# Patient Record
Sex: Male | Born: 1945 | Race: White | Hispanic: No | Marital: Married | Smoking: Never smoker
Health system: Southern US, Community
[De-identification: ages and names within clinical notes are randomized; demographics above are authoritative.]

## PROBLEM LIST (undated history)

## (undated) DIAGNOSIS — I10 Essential (primary) hypertension: Secondary | ICD-10-CM

---

## 2005-04-08 ENCOUNTER — Ambulatory Visit: Payer: Self-pay | Admitting: Gastroenterology

## 2013-03-30 DIAGNOSIS — Z85828 Personal history of other malignant neoplasm of skin: Secondary | ICD-10-CM | POA: Insufficient documentation

## 2016-08-27 DIAGNOSIS — Z96651 Presence of right artificial knee joint: Secondary | ICD-10-CM | POA: Diagnosis not present

## 2016-08-27 DIAGNOSIS — I10 Essential (primary) hypertension: Secondary | ICD-10-CM | POA: Diagnosis not present

## 2016-08-27 DIAGNOSIS — Z471 Aftercare following joint replacement surgery: Secondary | ICD-10-CM | POA: Diagnosis not present

## 2016-08-30 DIAGNOSIS — I10 Essential (primary) hypertension: Secondary | ICD-10-CM | POA: Diagnosis not present

## 2016-08-30 DIAGNOSIS — Z96651 Presence of right artificial knee joint: Secondary | ICD-10-CM | POA: Diagnosis not present

## 2016-08-30 DIAGNOSIS — Z471 Aftercare following joint replacement surgery: Secondary | ICD-10-CM | POA: Diagnosis not present

## 2016-09-01 DIAGNOSIS — Z471 Aftercare following joint replacement surgery: Secondary | ICD-10-CM | POA: Diagnosis not present

## 2016-09-01 DIAGNOSIS — Z96651 Presence of right artificial knee joint: Secondary | ICD-10-CM | POA: Diagnosis not present

## 2016-09-01 DIAGNOSIS — I10 Essential (primary) hypertension: Secondary | ICD-10-CM | POA: Diagnosis not present

## 2016-09-03 DIAGNOSIS — Z471 Aftercare following joint replacement surgery: Secondary | ICD-10-CM | POA: Diagnosis not present

## 2016-09-03 DIAGNOSIS — I10 Essential (primary) hypertension: Secondary | ICD-10-CM | POA: Diagnosis not present

## 2016-09-03 DIAGNOSIS — Z96651 Presence of right artificial knee joint: Secondary | ICD-10-CM | POA: Diagnosis not present

## 2016-09-06 DIAGNOSIS — I10 Essential (primary) hypertension: Secondary | ICD-10-CM | POA: Diagnosis not present

## 2016-09-06 DIAGNOSIS — Z471 Aftercare following joint replacement surgery: Secondary | ICD-10-CM | POA: Diagnosis not present

## 2016-09-06 DIAGNOSIS — Z96651 Presence of right artificial knee joint: Secondary | ICD-10-CM | POA: Diagnosis not present

## 2016-09-08 DIAGNOSIS — Z96651 Presence of right artificial knee joint: Secondary | ICD-10-CM | POA: Diagnosis not present

## 2016-09-08 DIAGNOSIS — I10 Essential (primary) hypertension: Secondary | ICD-10-CM | POA: Diagnosis not present

## 2016-09-08 DIAGNOSIS — Z471 Aftercare following joint replacement surgery: Secondary | ICD-10-CM | POA: Diagnosis not present

## 2016-12-21 DIAGNOSIS — K529 Noninfective gastroenteritis and colitis, unspecified: Secondary | ICD-10-CM | POA: Diagnosis not present

## 2017-03-14 DIAGNOSIS — Z0000 Encounter for general adult medical examination without abnormal findings: Secondary | ICD-10-CM | POA: Diagnosis not present

## 2017-03-14 DIAGNOSIS — Z6833 Body mass index (BMI) 33.0-33.9, adult: Secondary | ICD-10-CM | POA: Diagnosis not present

## 2017-03-14 DIAGNOSIS — Z96651 Presence of right artificial knee joint: Secondary | ICD-10-CM | POA: Diagnosis not present

## 2017-03-14 DIAGNOSIS — E7800 Pure hypercholesterolemia, unspecified: Secondary | ICD-10-CM | POA: Diagnosis not present

## 2017-03-14 DIAGNOSIS — Z7982 Long term (current) use of aspirin: Secondary | ICD-10-CM | POA: Diagnosis not present

## 2017-03-14 DIAGNOSIS — E669 Obesity, unspecified: Secondary | ICD-10-CM | POA: Diagnosis not present

## 2017-03-14 DIAGNOSIS — K219 Gastro-esophageal reflux disease without esophagitis: Secondary | ICD-10-CM | POA: Diagnosis not present

## 2017-03-14 DIAGNOSIS — I10 Essential (primary) hypertension: Secondary | ICD-10-CM | POA: Diagnosis not present

## 2017-03-14 DIAGNOSIS — Z791 Long term (current) use of non-steroidal anti-inflammatories (NSAID): Secondary | ICD-10-CM | POA: Diagnosis not present

## 2017-03-14 DIAGNOSIS — M1A9XX0 Chronic gout, unspecified, without tophus (tophi): Secondary | ICD-10-CM | POA: Diagnosis not present

## 2017-04-13 DIAGNOSIS — M79672 Pain in left foot: Secondary | ICD-10-CM | POA: Diagnosis not present

## 2017-04-19 ENCOUNTER — Other Ambulatory Visit: Payer: Self-pay | Admitting: Podiatry

## 2017-04-19 ENCOUNTER — Ambulatory Visit (INDEPENDENT_AMBULATORY_CARE_PROVIDER_SITE_OTHER): Payer: Medicare HMO | Admitting: Podiatry

## 2017-04-19 ENCOUNTER — Encounter: Payer: Self-pay | Admitting: Podiatry

## 2017-04-19 ENCOUNTER — Ambulatory Visit (INDEPENDENT_AMBULATORY_CARE_PROVIDER_SITE_OTHER): Payer: Medicare HMO

## 2017-04-19 VITALS — BP 153/85 | HR 72 | Temp 98.8°F | Resp 16

## 2017-04-19 DIAGNOSIS — M722 Plantar fascial fibromatosis: Secondary | ICD-10-CM

## 2017-04-19 DIAGNOSIS — M779 Enthesopathy, unspecified: Secondary | ICD-10-CM

## 2017-04-19 MED ORDER — BETAMETHASONE SOD PHOS & ACET 6 (3-3) MG/ML IJ SUSP: 3.0000 mg | Freq: Once | INTRAMUSCULAR | Status: DC

## 2017-04-19 NOTE — Progress Notes (Signed)
   Subjective:    Patient ID: Gary Owens, male    DOB: 02/29/1946, 71 y.o.   MRN: [id number]  HPI    Review of Systems  Musculoskeletal: Positive for gait problem.  All other systems reviewed and are negative.      Objective:   Physical Exam        Assessment & Plan:

## 2017-04-21 NOTE — Progress Notes (Signed)
   Subjective: Patient presents today for intermittent sharp pain and tenderness in the left heel that began three weeks ago. He reports a dull aching pain when walking. He reports mild associated swelling of the left ankle. Patient states that it hurts in the mornings with the first steps out of bed. Walking and standing for long periods of time also exacerbates the pain. Icing and resting the foot along with taking Aleve help alleviate the pain. Patient presents today for further treatment and evaluation.   No past medical history on file.   Objective: Physical Exam General: The patient is alert and oriented x3 in no acute distress.  Dermatology: Skin is warm, dry and supple bilateral lower extremities. Negative for open lesions or macerations bilateral.   Vascular: Dorsalis Pedis and Posterior Tibial pulses palpable bilateral.  Capillary fill time is immediate to all digits.  Neurological: Epicritic and protective threshold intact bilateral.   Musculoskeletal: Tenderness to palpation at the medial calcaneal tubercale and through the insertion of the plantar fascia of the left foot. All other joints range of motion within normal limits bilateral. Strength 5/5 in all groups bilateral.   Radiographic exam:   Normal osseous mineralization. Joint spaces preserved. No fracture/dislocation/boney destruction. Calcaneal spur present with mild thickening of plantar fascia left. No other soft tissue abnormalities or radiopaque foreign bodies.   Assessment: 1. Plantar fasciitis left foot  Plan of Care:  1. Patient evaluated. Xrays reviewed.   2. Injection of 0.5cc Celestone soluspan injected into the left plantar fascia.  3. Continue taking OTC Aleve as directed.  4. Plantar fascial band(s) dispensed  5. Continue wearing orthotics with New Balance shoes.  6. Return to clinic in 4 weeks.     Felecia ShellingBrent M. Evans, DPM [facility]  Dr. Felecia ShellingBrent M. Evans, DPM    [address]                                      [place], [place] [address]                Office [phone]  Fax [phone]) [phone]

## 2017-05-17 ENCOUNTER — Encounter: Payer: Self-pay | Admitting: Podiatry

## 2017-05-17 ENCOUNTER — Ambulatory Visit (INDEPENDENT_AMBULATORY_CARE_PROVIDER_SITE_OTHER): Payer: Medicare HMO | Admitting: Podiatry

## 2017-05-17 DIAGNOSIS — M722 Plantar fascial fibromatosis: Secondary | ICD-10-CM

## 2017-05-19 NOTE — Progress Notes (Signed)
   Subjective: Patient presents today for follow-up evaluation of left plantar fasciitis.  The patient reports significant relief.  He has been wearing the fascial brace which has helped alleviate the pain.  He denies any pain at this time.  There are no new complaints.  Patient presents today for further treatment and evaluation.   History reviewed. No pertinent past medical history.   Objective: Physical Exam General: The patient is alert and oriented x3 in no acute distress.  Dermatology: Skin is warm, dry and supple bilateral lower extremities. Negative for open lesions or macerations bilateral.   Vascular: Dorsalis Pedis and Posterior Tibial pulses palpable bilateral.  Capillary fill time is immediate to all digits.  Neurological: Epicritic and protective threshold intact bilateral.   Musculoskeletal: Range of motion within normal limits to all pedal and ankle joints bilateral. Muscle strength 5/5 in all groups bilateral.    Assessment: 1. Plantar fasciitis left foot - resolved  Plan of Care:  1. Patient evaluated. 2.  Continue wearing plantar fascia brace. 3.  Continue taking OTC Aleve. 4.  Continue wearing new balance shoes and insoles. 5.  Return to clinic as needed.  Felecia ShellingBrent M. Conlin Brahm, DPM [facility]  Dr. Felecia ShellingBrent M. Abreanna Drawdy, DPM    [address]                                     [place], [place] [address]                Office [phone]  Fax [phone]) [phone]

## 2017-05-30 DIAGNOSIS — H6501 Acute serous otitis media, right ear: Secondary | ICD-10-CM | POA: Diagnosis not present

## 2017-05-30 DIAGNOSIS — H6123 Impacted cerumen, bilateral: Secondary | ICD-10-CM | POA: Diagnosis not present

## 2017-11-28 DIAGNOSIS — Z6834 Body mass index (BMI) 34.0-34.9, adult: Secondary | ICD-10-CM | POA: Diagnosis not present

## 2017-11-28 DIAGNOSIS — Z809 Family history of malignant neoplasm, unspecified: Secondary | ICD-10-CM | POA: Diagnosis not present

## 2017-11-28 DIAGNOSIS — M109 Gout, unspecified: Secondary | ICD-10-CM | POA: Diagnosis not present

## 2017-11-28 DIAGNOSIS — Z791 Long term (current) use of non-steroidal anti-inflammatories (NSAID): Secondary | ICD-10-CM | POA: Diagnosis not present

## 2017-11-28 DIAGNOSIS — E669 Obesity, unspecified: Secondary | ICD-10-CM | POA: Diagnosis not present

## 2017-11-28 DIAGNOSIS — I10 Essential (primary) hypertension: Secondary | ICD-10-CM | POA: Diagnosis not present

## 2017-11-28 DIAGNOSIS — E785 Hyperlipidemia, unspecified: Secondary | ICD-10-CM | POA: Diagnosis not present

## 2017-11-28 DIAGNOSIS — L299 Pruritus, unspecified: Secondary | ICD-10-CM | POA: Diagnosis not present

## 2017-11-28 DIAGNOSIS — Z8249 Family history of ischemic heart disease and other diseases of the circulatory system: Secondary | ICD-10-CM | POA: Diagnosis not present

## 2017-11-28 DIAGNOSIS — K219 Gastro-esophageal reflux disease without esophagitis: Secondary | ICD-10-CM | POA: Diagnosis not present

## 2018-01-05 DIAGNOSIS — R69 Illness, unspecified: Secondary | ICD-10-CM | POA: Diagnosis not present

## 2018-02-27 ENCOUNTER — Emergency Department
Admission: EM | Admit: 2018-02-27 | Discharge: 2018-02-27 | Disposition: A | Discharge status: Home / Self Care | Payer: Medicare HMO | Attending: Emergency Medicine | Admitting: Emergency Medicine

## 2018-02-27 ENCOUNTER — Emergency Department: Payer: Medicare HMO

## 2018-02-27 ENCOUNTER — Other Ambulatory Visit: Payer: Self-pay

## 2018-02-27 ENCOUNTER — Encounter: Payer: Self-pay | Admitting: Emergency Medicine

## 2018-02-27 DIAGNOSIS — I10 Essential (primary) hypertension: Secondary | ICD-10-CM | POA: Insufficient documentation

## 2018-02-27 DIAGNOSIS — E860 Dehydration: Secondary | ICD-10-CM | POA: Diagnosis not present

## 2018-02-27 DIAGNOSIS — R531 Weakness: Secondary | ICD-10-CM | POA: Diagnosis not present

## 2018-02-27 DIAGNOSIS — A09 Infectious gastroenteritis and colitis, unspecified: Secondary | ICD-10-CM | POA: Diagnosis not present

## 2018-02-27 DIAGNOSIS — R509 Fever, unspecified: Secondary | ICD-10-CM | POA: Diagnosis not present

## 2018-02-27 DIAGNOSIS — R197 Diarrhea, unspecified: Secondary | ICD-10-CM | POA: Diagnosis not present

## 2018-02-27 DIAGNOSIS — R000 Tachycardia, unspecified: Secondary | ICD-10-CM | POA: Diagnosis not present

## 2018-02-27 DIAGNOSIS — Z79899 Other long term (current) drug therapy: Secondary | ICD-10-CM | POA: Insufficient documentation

## 2018-02-27 HISTORY — DX: Essential (primary) hypertension: I10

## 2018-02-27 LAB — GASTROINTESTINAL PANEL BY PCR, STOOL (REPLACES STOOL CULTURE)
Adenovirus F40/41: NOT DETECTED
Astrovirus: NOT DETECTED
CYCLOSPORA CAYETANENSIS: NOT DETECTED
Campylobacter species: DETECTED — AB
Cryptosporidium: NOT DETECTED
ENTAMOEBA HISTOLYTICA: NOT DETECTED
ENTEROAGGREGATIVE E COLI (EAEC): NOT DETECTED
Enteropathogenic E coli (EPEC): DETECTED — AB
Enterotoxigenic E coli (ETEC): NOT DETECTED
GIARDIA LAMBLIA: NOT DETECTED
NOROVIRUS GI/GII: NOT DETECTED
Plesimonas shigelloides: NOT DETECTED
Rotavirus A: NOT DETECTED
SALMONELLA SPECIES: NOT DETECTED
SHIGELLA/ENTEROINVASIVE E COLI (EIEC): NOT DETECTED
Sapovirus (I, II, IV, and V): NOT DETECTED
Shiga like toxin producing E coli (STEC): NOT DETECTED
VIBRIO CHOLERAE: NOT DETECTED
VIBRIO SPECIES: NOT DETECTED
Yersinia enterocolitica: NOT DETECTED

## 2018-02-27 LAB — C DIFFICILE QUICK SCREEN W PCR REFLEX
C DIFFICLE (CDIFF) ANTIGEN: NEGATIVE
C Diff interpretation: NOT DETECTED
C Diff toxin: NEGATIVE

## 2018-02-27 LAB — COMPREHENSIVE METABOLIC PANEL
ALT: 25 U/L (ref 0–44)
AST: 24 U/L (ref 15–41)
Albumin: 4.5 g/dL (ref 3.5–5.0)
Alkaline Phosphatase: 64 U/L (ref 38–126)
Anion gap: 11 (ref 5–15)
BILIRUBIN TOTAL: 1 mg/dL (ref 0.3–1.2)
BUN: 20 mg/dL (ref 8–23)
CHLORIDE: 99 mmol/L (ref 98–111)
CO2: 24 mmol/L (ref 22–32)
CREATININE: 1.24 mg/dL (ref 0.61–1.24)
Calcium: 8.9 mg/dL (ref 8.9–10.3)
GFR, EST NON AFRICAN AMERICAN: 56 mL/min — AB (ref >60)
Glucose, Bld: 158 mg/dL — ABNORMAL HIGH (ref 70–99)
Potassium: 3.6 mmol/L (ref 3.5–5.1)
Sodium: 134 mmol/L — ABNORMAL LOW (ref 135–145)
TOTAL PROTEIN: 7.5 g/dL (ref 6.5–8.1)

## 2018-02-27 LAB — CBC WITH DIFFERENTIAL/PLATELET
BASOS ABS: 0 10*3/uL (ref 0–0.1)
Basophils Relative: 0 %
EOS PCT: 0 %
Eosinophils Absolute: 0 10*3/uL (ref 0–0.7)
HCT: 41.8 % (ref 40.0–52.0)
HEMOGLOBIN: 14.6 g/dL (ref 13.0–18.0)
LYMPHS ABS: 0.9 10*3/uL — AB (ref 1.0–3.6)
Lymphocytes Relative: 7 %
MCH: 29.8 pg (ref 26.0–34.0)
MCHC: 35 g/dL (ref 32.0–36.0)
MCV: 85.1 fL (ref 80.0–100.0)
Monocytes Absolute: 0.7 10*3/uL (ref 0.2–1.0)
Monocytes Relative: 5 %
NEUTROS PCT: 88 %
Neutro Abs: 11.8 10*3/uL — ABNORMAL HIGH (ref 1.4–6.5)
PLATELETS: 164 10*3/uL (ref 150–440)
RBC: 4.92 MIL/uL (ref 4.40–5.90)
RDW: 14.1 % (ref 11.5–14.5)
WBC: 13.5 10*3/uL — AB (ref 3.8–10.6)

## 2018-02-27 LAB — URINALYSIS, COMPLETE (UACMP) WITH MICROSCOPIC
BILIRUBIN URINE: NEGATIVE
Bacteria, UA: NONE SEEN
GLUCOSE, UA: NEGATIVE mg/dL
Ketones, ur: NEGATIVE mg/dL
LEUKOCYTES UA: NEGATIVE
NITRITE: NEGATIVE
PROTEIN: 100 mg/dL — AB
SPECIFIC GRAVITY, URINE: 1.019 (ref 1.005–1.030)
Squamous Epithelial / LPF: NONE SEEN (ref 0–5)
pH: 5 (ref 5.0–8.0)

## 2018-02-27 LAB — TROPONIN I: Troponin I: <0.03 ng/mL (ref <0.03)

## 2018-02-27 LAB — INFLUENZA PANEL BY PCR (TYPE A & B)
INFLAPCR: NEGATIVE
Influenza B By PCR: NEGATIVE

## 2018-02-27 LAB — LACTIC ACID, PLASMA: LACTIC ACID, VENOUS: 1.8 mmol/L (ref 0.5–1.9)

## 2018-02-27 MED ORDER — SODIUM CHLORIDE 0.9 % IV BOLUS
1000.0000 mL | Freq: Once | INTRAVENOUS | Status: AC
  Administered 2018-02-27: 1000 mL via INTRAVENOUS

## 2018-02-27 MED ORDER — ACETAMINOPHEN 500 MG PO TABS
1000.0000 mg | ORAL_TABLET | Freq: Once | ORAL | Status: AC
  Administered 2018-02-27: 1000 mg via ORAL

## 2018-02-27 MED ORDER — AZITHROMYCIN 500 MG PO TABS: 500.0000 mg | ORAL_TABLET | Freq: Every day | ORAL | 0 refills | Status: AC

## 2018-02-27 MED ORDER — AZITHROMYCIN 500 MG PO TABS
500.0000 mg | ORAL_TABLET | Freq: Once | ORAL | Status: AC
  Administered 2018-02-27: 500 mg via ORAL
  Filled 2018-02-27: qty 1

## 2018-02-27 NOTE — ED Triage Notes (Signed)
Pt in via EMS from [facility] with c/o weakness and fever for the last couple of days. EMS reports per facility pt went to the BR, felt weak and slid down to the floor.

## 2018-02-27 NOTE — ED Provider Notes (Addendum)
[facility] Emergency Department Provider Note  ____________________________________________   I have reviewed the triage vital signs and the nursing notes. Where available I have reviewed prior notes and, if possible and indicated, outside hospital notes.    HISTORY  Chief Complaint Weakness    HPI Gary Owens is a 72 y.o. male who presents today complaining getting weak.  Is not been nauseated or vomited but he has had decreased appetite since last night.  And then today he had one very watery stool.  As he was getting off the toilet he felt lightheaded and gradually sank down to the floor.  He felt very weak and was unable to get up immediately.  He did not hit his head did not pass out no chest pain or shortness of breath.  Check his temperature at home but he did feel that he was getting a fever today.  No recent travel no recent antibiotics no abdominal tenderness.  No focal numbness or weakness no chest pain or shortness of breath.    Past Medical History:  Diagnosis Date  . Hypertension     There are no active problems to display for this patient.   History reviewed. No pertinent surgical history.  Prior to Admission medications   Medication Sig Start Date End Date Taking? Authorizing Provider  allopurinol (ZYLOPRIM) 100 MG tablet Take 100 mg daily by mouth. 03/19/17  Yes [provider]  amLODipine (NORVASC) 5 MG tablet Take 5 mg daily by mouth. 03/19/17  Yes [provider]  atorvastatin (LIPITOR) 40 MG tablet Take 40 mg daily by mouth. 03/19/17  Yes [provider]  carvedilol (COREG) 12.5 MG tablet Take 12.5 mg by mouth 2 (two) times daily.    Yes [provider]  lisinopril (PRINIVIL,ZESTRIL) 40 MG tablet Take 40 mg by mouth daily.    Yes [provider]  omeprazole (PRILOSEC) 40 MG capsule Take 40 mg daily by mouth. 03/19/17  Yes [provider]  amoxicillin (AMOXIL) 500 MG capsule TAKE  4 CAPSULES BY MOUTH 1 HOUR PRIOR TO DENTAL TREATMENT 04/11/17   [provider]    Allergies Codeine and Hydrochlorothiazide  No family history on file.  Social History Social History   Tobacco Use  . Smoking status: Never Smoker  . Smokeless tobacco: Never Used  Substance Use Topics  . Alcohol use: Yes    Comment: 1 drink/month  . Drug use: No    Review of Systems Constitutional: No fever/chills Eyes: No visual changes. ENT: No sore throat. No stiff neck no neck pain Cardiovascular: Denies chest pain. Respiratory: Denies shortness of breath. Gastrointestinal:   no vomiting.  + diarrhea.  No constipation. Genitourinary: Negative for dysuria. Musculoskeletal: Negative lower extremity swelling Skin: Negative for rash. Neurological: Negative for severe headaches, focal weakness or numbness.   ____________________________________________   PHYSICAL EXAM:  VITAL SIGNS: ED Triage Vitals  Enc Vitals Group     BP 02/27/18 1348 (!) 155/76     Pulse Rate 02/27/18 1348 (!) 103     Resp 02/27/18 1348 20     Temp 02/27/18 1348 (!) 102.7 F (39.3 C)     Temp Source 02/27/18 1348 Oral     SpO2 02/27/18 1348 96 %     Weight 02/27/18 1349 170 lb (77.1 kg)     Height 02/27/18 1349 5\' 11"  (1.803 m)     Head Circumference --      Peak Flow --  Pain Score 02/27/18 1349 0     Pain Loc --      Pain Edu? --      Excl. in GC? --     Constitutional: Alert and oriented. Well appearing and in no acute distress. Eyes: Conjunctivae are normal Head: Atraumatic HEENT: No congestion/rhinnorhea. Mucous membranes are moist.  Oropharynx non-erythematous Neck:   Nontender with no meningismus, no masses, no stridor Cardiovascular: Normal rate, regular rhythm. Grossly normal heart sounds.  Good peripheral circulation. Respiratory: Normal respiratory effort.  No retractions. Lungs CTAB. Abdominal: Soft and nontender. No distention. No guarding no rebound Back:  There is no  focal tenderness or step off.  there is no midline tenderness there are no lesions noted. there is no CVA tenderness Musculoskeletal: No lower extremity tenderness, no upper extremity tenderness. No joint effusions, no DVT signs strong distal pulses no edema Neurologic:  Normal speech and language. No gross focal neurologic deficits are appreciated.  Skin:  Skin is warm, dry and intact. No rash noted. Psychiatric: Mood and affect are normal. Speech and behavior are normal.  ____________________________________________   LABS (all labs ordered are listed, but only abnormal results are displayed)  Labs Reviewed  COMPREHENSIVE METABOLIC PANEL - Abnormal; Notable for the following components:      Result Value   Sodium 134 (*)    Glucose, Bld 158 (*)    GFR calc non Af Amer 56 (*)    All other components within normal limits  CBC WITH DIFFERENTIAL/PLATELET - Abnormal; Notable for the following components:   WBC 13.5 (*)    Neutro Abs 11.8 (*)    Lymphs Abs 0.9 (*)    All other components within normal limits  GASTROINTESTINAL PANEL BY PCR, STOOL (REPLACES STOOL CULTURE)  C DIFFICILE QUICK SCREEN W PCR REFLEX  TROPONIN I  URINALYSIS, COMPLETE (UACMP) WITH MICROSCOPIC  INFLUENZA PANEL BY PCR (TYPE A & B)  LACTIC ACID, PLASMA  LACTIC ACID, PLASMA    Pertinent labs  results that were available during my care of the patient were reviewed by me and considered in my medical decision making (see chart for details). ____________________________________________  EKG  I personally interpreted any EKGs ordered by me or triage Normal sinus rhythm, mild tachycardia at 101 noted, no acute ST elevation or depression ossific ST changes, LAD noted. ____________________________________________  RADIOLOGY  Pertinent labs & imaging results that were available during my care of the patient were reviewed by me and considered in my medical decision making (see chart for details). If possible,  patient and/or family made aware of any abnormal findings.  Dg Chest 2 View  Result Date: 02/27/2018 CLINICAL DATA:  Fever and weakness EXAM: CHEST - 2 VIEW COMPARISON:  None. FINDINGS: The heart size and mediastinal contours are within normal limits. Both lungs are clear. The visualized skeletal structures are unremarkable. IMPRESSION: No active cardiopulmonary disease. Electronically Signed   By: Jasmine Pang M.D.   On: 02/27/2018 14:20   ____________________________________________    PROCEDURES  Procedure(s) performed: None  Procedures  Critical Care performed: None  ____________________________________________   INITIAL IMPRESSION / ASSESSMENT AND PLAN / ED COURSE  Pertinent labs & imaging results that were available during my care of the patient were reviewed by me and considered in my medical decision making (see chart for details).  Patient here with a fever, and feeling generally lightheaded.  No focal numbness or weakness normal neurologic exam did not hit his head no evidence of trauma, if the only  other a contributing symptom has been diarrhea, will send a bile fire give him IV fluids, this is most likely a viral pathology however we will reassess.  No melena no bright red blood per rectum reported hemoglobin is reassuring white count is somewhat elevated.  Patient feels much better after clinically defervesced and care we will recheck temperature.  I will check influenza as we are starting to see that, and we will check for C. difficile.  Abdomen is benign with no indication for imaging, and his neurologic exam is reassuring with no evidence of head trauma I do not think he needs a CT scan of his head.  He was only on the ground for less than an hour.  I do not think he likely has rhabdomyolysis.  ----------------------------------------- 8:00 PM on 02/27/2018 -----------------------------------------  Blood work is reassuring, patient is not orthostatic he has no  complaints with extensive no evidence of sepsis, no evidence of uncontrolled diarrhea he did give [place] a stool sample finally Pleas Patricia is now working on it, otherwise urine is reassuring no evidence of significant dehydration, he is able to stand up and walk about with no efforts or issues.  He states that he just felt a little weak earlier.  He does not want to be admitted to the hospital.  Complaint is that he had diarrhea earlier that made him feel weak.  At this time he looks quite robust.  He is eating and drinking here.  Chest x-ray is reassuring.  Lactic is negative.  We will see if and give [place] a stool sample prior to discharge   ----------------------------------------- 9:01 PM on 02/27/2018 -----------------------------------------  Patient appears quite robust in no acute distress.  Abdomen is benign on serial exams, and declines offered admission states he much prefer to go home, no evidence of sepsis or other acute pathology of that variety, has a diarrheal illness and felt lightheaded, he will be with family today.  He feels 100% better after fluids he is eating and drinking.  ,b ----------------------------------------- 9:58 PM on 02/27/2018 -----------------------------------------  Positive for E. coli and Campylobacter we will start him on azithromycin given his symptomology and have him follow-up.   ____________________________________________   FINAL CLINICAL IMPRESSION(S) / ED DIAGNOSES  Final diagnoses:  None      This chart was dictated using voice recognition software.  Despite best efforts to proofread,  errors can occur which can change meaning.      Jeanmarie Plant, MD 02/27/18 1840    Jeanmarie Plant, MD 02/27/18 2008    Jeanmarie Plant, MD 02/27/18 2159

## 2018-02-27 NOTE — ED Notes (Signed)
Pt given urinal for urine collection 

## 2018-02-27 NOTE — ED Notes (Signed)
Pt given meal tray ok per MD 

## 2018-02-27 NOTE — Discharge Instructions (Addendum)
Take Tylenol or Motrin to keep yourself from having high fevers drink plenty of fluids stay with family, you have bacterial diarrhea which should get better on its own but we will give you antibiotics which should help to get better faster.  If you have any bleeding, weakness or you feel worse in any way return to the emergency room

## 2018-04-21 DIAGNOSIS — J069 Acute upper respiratory infection, unspecified: Secondary | ICD-10-CM | POA: Diagnosis not present

## 2018-12-22 IMAGING — CR DG CHEST 2V
1 series · 2 of 2 positions shown · non-contrast
Comparison: None.

CLINICAL DATA: Fever and weakness

EXAM:
CHEST - 2 VIEW

[Series 1: w chest pa · 0.14mm/px · 2 of 2 slices shown]
[im 1/2]
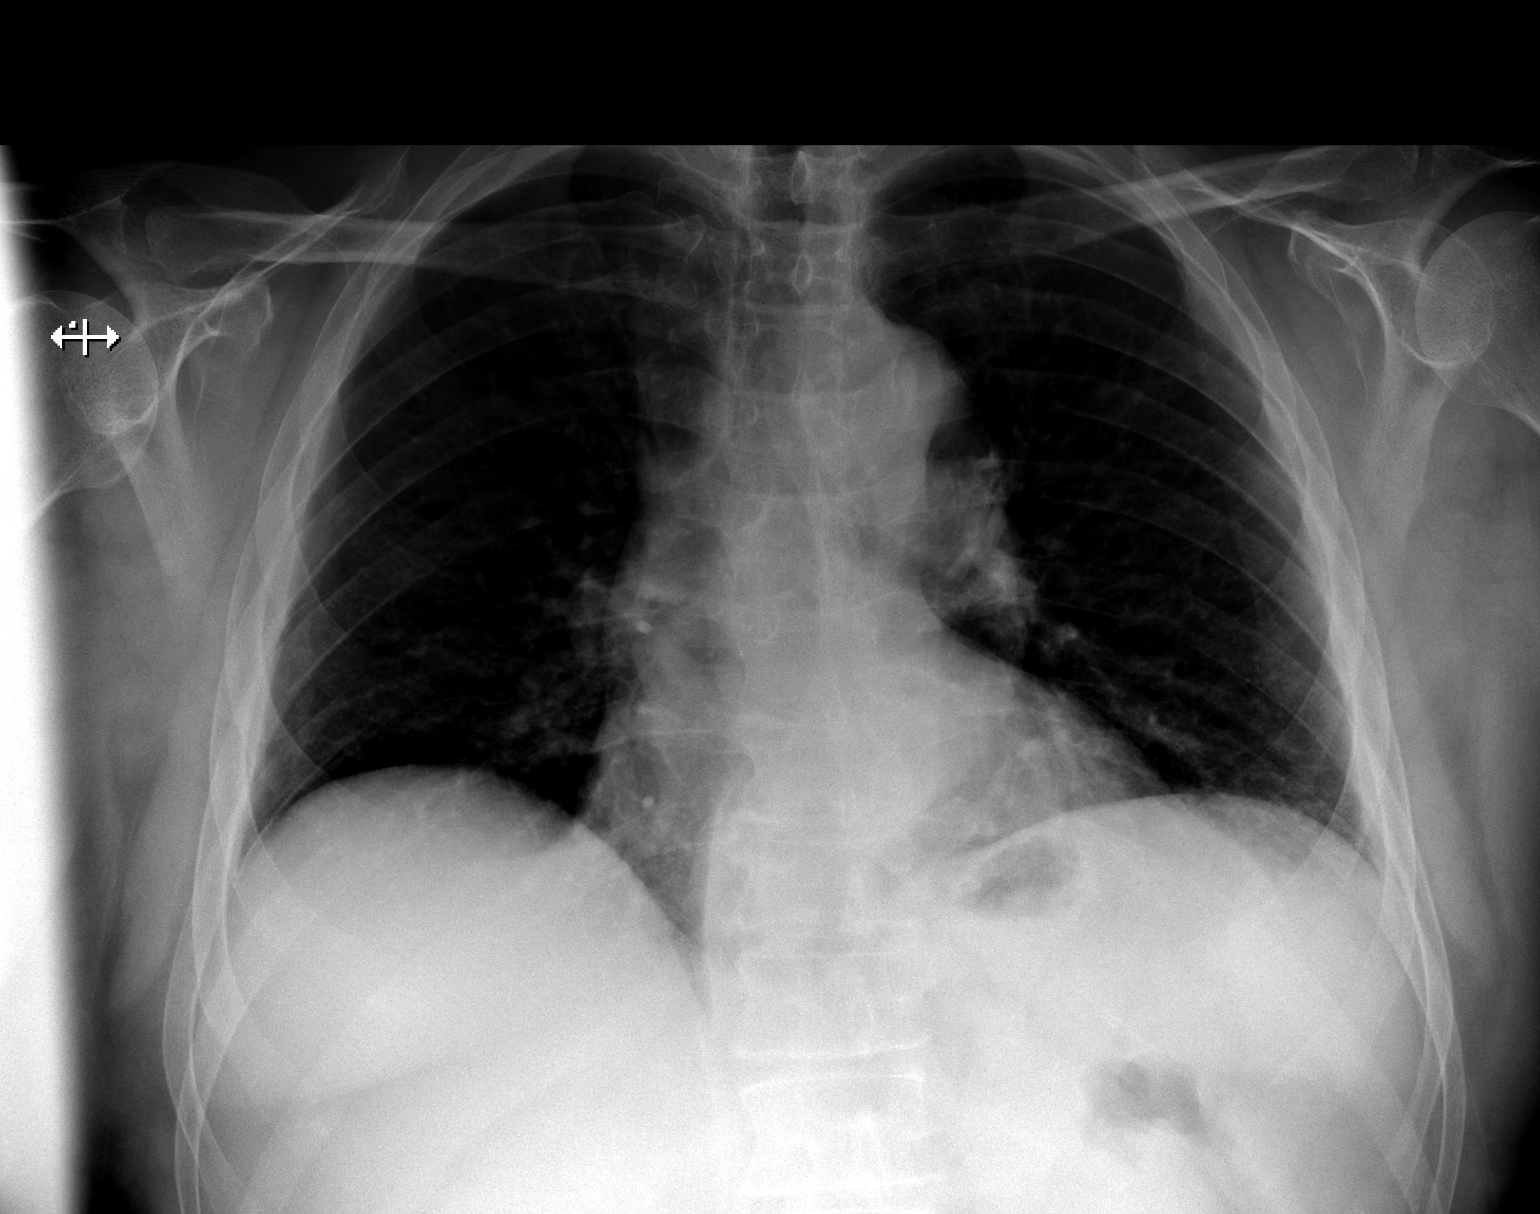
[im 2/2]
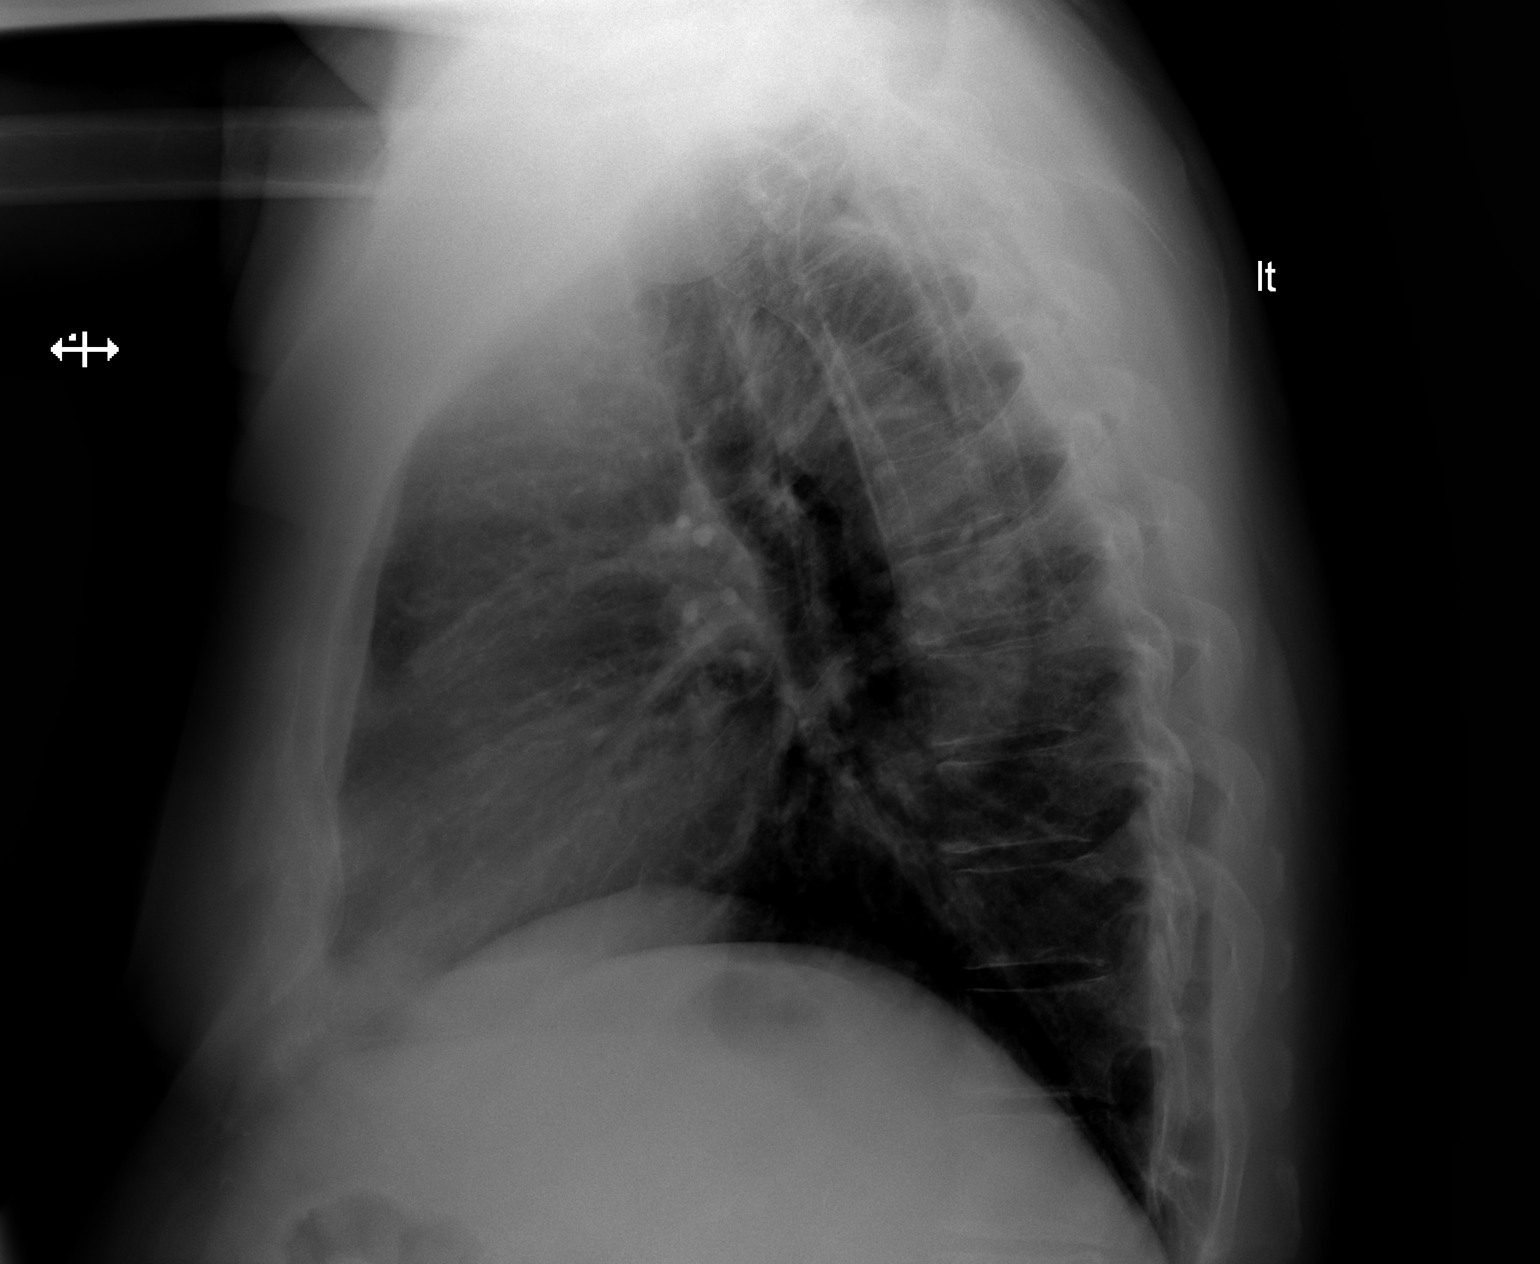

[2 of 2 positions shown; findings below may reference images not displayed]

FINDINGS: The heart size and mediastinal contours are within normal limits.
Both lungs are clear. The visualized skeletal structures are
unremarkable.
IMPRESSION: No active cardiopulmonary disease.

## 2019-02-20 DIAGNOSIS — R69 Illness, unspecified: Secondary | ICD-10-CM | POA: Diagnosis not present

## 2019-04-30 DIAGNOSIS — K219 Gastro-esophageal reflux disease without esophagitis: Secondary | ICD-10-CM | POA: Diagnosis not present

## 2019-04-30 DIAGNOSIS — Z8249 Family history of ischemic heart disease and other diseases of the circulatory system: Secondary | ICD-10-CM | POA: Diagnosis not present

## 2019-04-30 DIAGNOSIS — E785 Hyperlipidemia, unspecified: Secondary | ICD-10-CM | POA: Diagnosis not present

## 2019-04-30 DIAGNOSIS — Z85828 Personal history of other malignant neoplasm of skin: Secondary | ICD-10-CM | POA: Diagnosis not present

## 2019-04-30 DIAGNOSIS — M109 Gout, unspecified: Secondary | ICD-10-CM | POA: Diagnosis not present

## 2019-04-30 DIAGNOSIS — I10 Essential (primary) hypertension: Secondary | ICD-10-CM | POA: Diagnosis not present

## 2019-04-30 DIAGNOSIS — E669 Obesity, unspecified: Secondary | ICD-10-CM | POA: Diagnosis not present

## 2019-08-06 DIAGNOSIS — R69 Illness, unspecified: Secondary | ICD-10-CM | POA: Diagnosis not present

## 2019-12-24 DIAGNOSIS — G8929 Other chronic pain: Secondary | ICD-10-CM | POA: Diagnosis not present

## 2019-12-24 DIAGNOSIS — K219 Gastro-esophageal reflux disease without esophagitis: Secondary | ICD-10-CM | POA: Diagnosis not present

## 2019-12-24 DIAGNOSIS — E669 Obesity, unspecified: Secondary | ICD-10-CM | POA: Diagnosis not present

## 2019-12-24 DIAGNOSIS — I10 Essential (primary) hypertension: Secondary | ICD-10-CM | POA: Diagnosis not present

## 2019-12-24 DIAGNOSIS — E785 Hyperlipidemia, unspecified: Secondary | ICD-10-CM | POA: Diagnosis not present

## 2019-12-24 DIAGNOSIS — Z809 Family history of malignant neoplasm, unspecified: Secondary | ICD-10-CM | POA: Diagnosis not present

## 2019-12-24 DIAGNOSIS — M109 Gout, unspecified: Secondary | ICD-10-CM | POA: Diagnosis not present

## 2019-12-24 DIAGNOSIS — Z6833 Body mass index (BMI) 33.0-33.9, adult: Secondary | ICD-10-CM | POA: Diagnosis not present

## 2019-12-24 DIAGNOSIS — Z791 Long term (current) use of non-steroidal anti-inflammatories (NSAID): Secondary | ICD-10-CM | POA: Diagnosis not present

## 2019-12-24 DIAGNOSIS — Z008 Encounter for other general examination: Secondary | ICD-10-CM | POA: Diagnosis not present

## 2019-12-24 DIAGNOSIS — M1990 Unspecified osteoarthritis, unspecified site: Secondary | ICD-10-CM | POA: Diagnosis not present

## 2020-02-04 DIAGNOSIS — R69 Illness, unspecified: Secondary | ICD-10-CM | POA: Diagnosis not present

## 2020-02-25 DIAGNOSIS — R69 Illness, unspecified: Secondary | ICD-10-CM | POA: Diagnosis not present

## 2020-05-12 DIAGNOSIS — R69 Illness, unspecified: Secondary | ICD-10-CM | POA: Diagnosis not present

## 2021-11-26 DIAGNOSIS — I10 Essential (primary) hypertension: Secondary | ICD-10-CM | POA: Diagnosis not present

## 2021-11-26 DIAGNOSIS — Z7984 Long term (current) use of oral hypoglycemic drugs: Secondary | ICD-10-CM | POA: Diagnosis not present

## 2021-11-26 DIAGNOSIS — E669 Obesity, unspecified: Secondary | ICD-10-CM | POA: Diagnosis not present

## 2021-11-26 DIAGNOSIS — E785 Hyperlipidemia, unspecified: Secondary | ICD-10-CM | POA: Diagnosis not present

## 2021-11-26 DIAGNOSIS — Z791 Long term (current) use of non-steroidal anti-inflammatories (NSAID): Secondary | ICD-10-CM | POA: Diagnosis not present

## 2021-11-26 DIAGNOSIS — Z8249 Family history of ischemic heart disease and other diseases of the circulatory system: Secondary | ICD-10-CM | POA: Diagnosis not present

## 2021-11-26 DIAGNOSIS — Z7985 Long-term (current) use of injectable non-insulin antidiabetic drugs: Secondary | ICD-10-CM | POA: Diagnosis not present

## 2021-11-26 DIAGNOSIS — E119 Type 2 diabetes mellitus without complications: Secondary | ICD-10-CM | POA: Diagnosis not present

## 2021-11-26 DIAGNOSIS — K219 Gastro-esophageal reflux disease without esophagitis: Secondary | ICD-10-CM | POA: Diagnosis not present

## 2021-11-26 DIAGNOSIS — M1990 Unspecified osteoarthritis, unspecified site: Secondary | ICD-10-CM | POA: Diagnosis not present

## 2021-11-26 DIAGNOSIS — Z6830 Body mass index (BMI) 30.0-30.9, adult: Secondary | ICD-10-CM | POA: Diagnosis not present

## 2021-11-26 DIAGNOSIS — Z85828 Personal history of other malignant neoplasm of skin: Secondary | ICD-10-CM | POA: Diagnosis not present

## 2021-12-14 DIAGNOSIS — B372 Candidiasis of skin and nail: Secondary | ICD-10-CM | POA: Diagnosis not present

## 2022-01-15 DIAGNOSIS — L200 Besnier's prurigo: Secondary | ICD-10-CM | POA: Diagnosis not present

## 2022-12-15 DIAGNOSIS — M722 Plantar fascial fibromatosis: Secondary | ICD-10-CM | POA: Diagnosis not present

## 2022-12-31 ENCOUNTER — Encounter: Payer: Self-pay | Admitting: Podiatry

## 2022-12-31 ENCOUNTER — Ambulatory Visit (INDEPENDENT_AMBULATORY_CARE_PROVIDER_SITE_OTHER): Payer: Medicare HMO | Admitting: Podiatry

## 2022-12-31 ENCOUNTER — Ambulatory Visit (INDEPENDENT_AMBULATORY_CARE_PROVIDER_SITE_OTHER): Payer: Medicare HMO

## 2022-12-31 DIAGNOSIS — M722 Plantar fascial fibromatosis: Secondary | ICD-10-CM

## 2022-12-31 MED ORDER — METHYLPREDNISOLONE 4 MG PO TBPK: ORAL_TABLET | ORAL | 0 refills | Status: AC

## 2022-12-31 MED ORDER — MELOXICAM 15 MG PO TABS: 15.0000 mg | ORAL_TABLET | Freq: Every day | ORAL | 1 refills | Status: DC

## 2022-12-31 MED ORDER — BETAMETHASONE SOD PHOS & ACET 6 (3-3) MG/ML IJ SUSP
3.0000 mg | Freq: Once | INTRAMUSCULAR | Status: AC
  Administered 2022-12-31: 3 mg via INTRA_ARTICULAR

## 2022-12-31 NOTE — Progress Notes (Signed)
   Chief Complaint  Patient presents with   Foot Pain    "I have what the doctor at Urgent Care called Plantar Fasciitis." N - heel pain L - plantar heel right D - 6 weeks O - gradually worse C - sharp pain A - walking, get up in the morning T - [facility]gent [facility], Tylenol, Ibuprofen, a shoe insert    Subjective: 77 y.o. male presenting for above complaint. Cannot recall and incident or event that would have elicited the pain. Has tried Dr. Jari Sportsman insoles with minimal relief   Past Medical History:  Diagnosis Date   Hypertension      Objective: Physical Exam General: The patient is alert and oriented x3 in no acute distress.  Dermatology: Skin is warm, dry and supple bilateral lower extremities. Negative for open lesions or macerations bilateral.   Vascular: Dorsalis Pedis and Posterior Tibial pulses palpable bilateral.  Capillary fill time is immediate to all digits.  Neurological: Light touch and protective threshold intact bilateral.   Musculoskeletal: Tenderness to palpation to the plantar aspect of the right heel along the plantar fascia. All other joints range of motion within normal limits bilateral. Strength 5/5 in all groups bilateral.   Radiographic exam RT foot 12/31/2022: Normal osseous mineralization. Joint spaces preserved. No fracture/dislocation/boney destruction. No other soft tissue abnormalities or radiopaque foreign bodies. Plantar and posterior heel spur noted.   Assessment: 1. Plantar fasciitis right  Plan of Care:  -Patient evaluated. Xrays reviewed.   -Injection of 0.5cc Celestone soluspan injected into the right plantar fascia  -Rx for Medrol Dose Pack placed -Rx for Meloxicam ordered for patient. -OTC powerstep insoles dispensed. Wear daily -Instructed patient regarding therapies and modalities at home to alleviate symptoms.  -RTC for routine diabetic foot care   Felecia Shelling, DPM [facility]  Dr. Felecia Shelling, DPM    [address] [place], [place] [address]                Office [phone]  Fax [phone]

## 2023-01-03 ENCOUNTER — Encounter: Payer: Self-pay | Admitting: Podiatry

## 2023-01-03 ENCOUNTER — Ambulatory Visit: Payer: Medicare HMO | Admitting: Podiatry

## 2023-01-03 VITALS — BP 132/67

## 2023-01-03 DIAGNOSIS — Z135 Encounter for screening for eye and ear disorders: Secondary | ICD-10-CM | POA: Insufficient documentation

## 2023-01-03 DIAGNOSIS — Z461 Encounter for fitting and adjustment of hearing aid: Secondary | ICD-10-CM | POA: Insufficient documentation

## 2023-01-03 DIAGNOSIS — G20A1 Parkinson's disease without dyskinesia, without mention of fluctuations: Secondary | ICD-10-CM | POA: Insufficient documentation

## 2023-01-03 DIAGNOSIS — M79675 Pain in left toe(s): Secondary | ICD-10-CM | POA: Diagnosis not present

## 2023-01-03 DIAGNOSIS — H40012 Open angle with borderline findings, low risk, left eye: Secondary | ICD-10-CM | POA: Insufficient documentation

## 2023-01-03 DIAGNOSIS — D485 Neoplasm of uncertain behavior of skin: Secondary | ICD-10-CM | POA: Insufficient documentation

## 2023-01-03 DIAGNOSIS — M109 Gout, unspecified: Secondary | ICD-10-CM | POA: Insufficient documentation

## 2023-01-03 DIAGNOSIS — E785 Hyperlipidemia, unspecified: Secondary | ICD-10-CM | POA: Insufficient documentation

## 2023-01-03 DIAGNOSIS — H401123 Primary open-angle glaucoma, left eye, severe stage: Secondary | ICD-10-CM | POA: Insufficient documentation

## 2023-01-03 DIAGNOSIS — E119 Type 2 diabetes mellitus without complications: Secondary | ICD-10-CM

## 2023-01-03 DIAGNOSIS — B351 Tinea unguium: Secondary | ICD-10-CM | POA: Diagnosis not present

## 2023-01-03 DIAGNOSIS — M79674 Pain in right toe(s): Secondary | ICD-10-CM | POA: Diagnosis not present

## 2023-01-03 DIAGNOSIS — H02132 Senile ectropion of right lower eyelid: Secondary | ICD-10-CM | POA: Insufficient documentation

## 2023-01-03 DIAGNOSIS — M25551 Pain in right hip: Secondary | ICD-10-CM | POA: Insufficient documentation

## 2023-01-03 DIAGNOSIS — M545 Low back pain, unspecified: Secondary | ICD-10-CM | POA: Insufficient documentation

## 2023-01-03 DIAGNOSIS — I10 Essential (primary) hypertension: Secondary | ICD-10-CM | POA: Insufficient documentation

## 2023-01-03 DIAGNOSIS — E1129 Type 2 diabetes mellitus with other diabetic kidney complication: Secondary | ICD-10-CM | POA: Insufficient documentation

## 2023-01-03 DIAGNOSIS — L299 Pruritus, unspecified: Secondary | ICD-10-CM | POA: Insufficient documentation

## 2023-01-03 DIAGNOSIS — Q828 Other specified congenital malformations of skin: Secondary | ICD-10-CM | POA: Insufficient documentation

## 2023-01-03 DIAGNOSIS — H04213 Epiphora due to excess lacrimation, bilateral lacrimal glands: Secondary | ICD-10-CM | POA: Insufficient documentation

## 2023-01-03 DIAGNOSIS — K219 Gastro-esophageal reflux disease without esophagitis: Secondary | ICD-10-CM | POA: Insufficient documentation

## 2023-01-03 DIAGNOSIS — L03011 Cellulitis of right finger: Secondary | ICD-10-CM | POA: Insufficient documentation

## 2023-01-03 DIAGNOSIS — L0100 Impetigo, unspecified: Secondary | ICD-10-CM | POA: Insufficient documentation

## 2023-01-03 DIAGNOSIS — L570 Actinic keratosis: Secondary | ICD-10-CM | POA: Insufficient documentation

## 2023-01-03 DIAGNOSIS — H6123 Impacted cerumen, bilateral: Secondary | ICD-10-CM | POA: Insufficient documentation

## 2023-01-08 ENCOUNTER — Encounter: Payer: Self-pay | Admitting: Podiatry

## 2023-01-08 NOTE — Progress Notes (Signed)
  Subjective:  Patient ID: Gary Owens, male    DOB: [birth year],  MRN: [id number]  OLEKSANDR VENUS presents to clinic today for: preventative diabetic foot care and painful elongated mycotic toenails 1-5 bilaterally which are tender when wearing enclosed shoe gear. Pain is relieved with periodic professional debridement.  Chief Complaint  Patient presents with   Nail Problem    Rfc,Referring Provider [facility],lov:6 mos. ago       PCP is [facility].  Allergies  Allergen Reactions   Codeine Other (See Comments)    NOT ASSESSED   Hydrochlorothiazide Other (See Comments)    Gout    Review of Systems: Negative except as noted in the HPI.  Objective: No changes noted in today's physical examination. Vitals:   01/03/23 1618  BP: 132/67    CERRONE BARBUSH is a pleasant 77 y.o. male in NAD. AAO x 3.  Vascular Examination: Capillary refill time <3 seconds b/l LE. Palpable pedal pulses b/l LE. Digital hair present b/l. No pedal edema b/l. Skin temperature gradient WNL b/l. No varicosities b/l. No ischemia or gangrene noted b/l LE. No cyanosis or clubbing noted b/l LE.Marland Kitchen  Dermatological Examination: Pedal skin with normal turgor, texture and tone b/l. No open wounds. No interdigital macerations b/l. Toenails 1-5 b/l thickened, discolored, dystrophic with subungual debris. There is pain on palpation to dorsal aspect of nailplates. No corns, calluses nor porokeratotic lesions noted..  Neurological Examination: Protective sensation intact with 10 gram monofilament b/l LE.   Musculoskeletal Examination: Normal muscle strength 5/5 to all lower extremity muscle groups bilaterally. No pain, crepitus or joint limitation noted with ROM b/l LE. No gross bony pedal deformities b/l. Patient ambulates independently without assistive aids.  Assessment/Plan: 1. Pain due to onychomycosis of toenails of both feet   2. Type 2 diabetes mellitus without complication, without  long-term current use of insulin (HCC)     Patient was evaluated and treated. All patient's and/or POA's questions/concerns addressed on today's visit. Toenails 1-5 debrided in length and girth without incident. Continue soft, supportive shoe gear daily. Report any pedal injuries to medical professional. Call office if there are any questions/concerns. -Continue foot and shoe inspections daily. Monitor blood glucose per PCP/Endocrinologist's recommendations. -Patient/POA to call should there be question/concern in the interim.   Return in about 3 months (around 04/05/2023).  Freddie Breech, DPM

## 2023-02-23 ENCOUNTER — Other Ambulatory Visit: Payer: Self-pay | Admitting: Podiatry

## 2023-03-30 DIAGNOSIS — I10 Essential (primary) hypertension: Secondary | ICD-10-CM | POA: Diagnosis not present

## 2023-03-30 DIAGNOSIS — Z8249 Family history of ischemic heart disease and other diseases of the circulatory system: Secondary | ICD-10-CM | POA: Diagnosis not present

## 2023-03-30 DIAGNOSIS — Z888 Allergy status to other drugs, medicaments and biological substances status: Secondary | ICD-10-CM | POA: Diagnosis not present

## 2023-03-30 DIAGNOSIS — E119 Type 2 diabetes mellitus without complications: Secondary | ICD-10-CM | POA: Diagnosis not present

## 2023-03-30 DIAGNOSIS — Z7985 Long-term (current) use of injectable non-insulin antidiabetic drugs: Secondary | ICD-10-CM | POA: Diagnosis not present

## 2023-03-30 DIAGNOSIS — Z7984 Long term (current) use of oral hypoglycemic drugs: Secondary | ICD-10-CM | POA: Diagnosis not present

## 2023-03-30 DIAGNOSIS — Z791 Long term (current) use of non-steroidal anti-inflammatories (NSAID): Secondary | ICD-10-CM | POA: Diagnosis not present

## 2023-03-30 DIAGNOSIS — H409 Unspecified glaucoma: Secondary | ICD-10-CM | POA: Diagnosis not present

## 2023-03-30 DIAGNOSIS — M1990 Unspecified osteoarthritis, unspecified site: Secondary | ICD-10-CM | POA: Diagnosis not present

## 2023-03-30 DIAGNOSIS — G20A1 Parkinson's disease without dyskinesia, without mention of fluctuations: Secondary | ICD-10-CM | POA: Diagnosis not present

## 2023-03-30 DIAGNOSIS — E785 Hyperlipidemia, unspecified: Secondary | ICD-10-CM | POA: Diagnosis not present

## 2023-03-30 DIAGNOSIS — Z885 Allergy status to narcotic agent status: Secondary | ICD-10-CM | POA: Diagnosis not present

## 2023-04-07 ENCOUNTER — Ambulatory Visit: Payer: Medicare HMO | Admitting: Podiatry

## 2023-04-11 ENCOUNTER — Encounter: Payer: Self-pay | Admitting: Podiatry

## 2023-04-11 ENCOUNTER — Ambulatory Visit: Payer: Medicare HMO | Admitting: Podiatry

## 2023-04-11 VITALS — Ht 71.0 in | Wt 170.0 lb

## 2023-04-11 DIAGNOSIS — E119 Type 2 diabetes mellitus without complications: Secondary | ICD-10-CM

## 2023-04-11 DIAGNOSIS — M79675 Pain in left toe(s): Secondary | ICD-10-CM

## 2023-04-11 DIAGNOSIS — M79674 Pain in right toe(s): Secondary | ICD-10-CM

## 2023-04-11 DIAGNOSIS — B351 Tinea unguium: Secondary | ICD-10-CM

## 2023-04-17 NOTE — Progress Notes (Signed)
ANNUAL DIABETIC FOOT EXAM  Subjective: Gary Owens presents today for annual diabetic foot exam. Chief Complaint  Patient presents with   Nail Problem    DFC, Pt is a diabetic, last A1C was 5.3 , last office visit was last week, and PCP is Dr Adriana Simas.   Patient confirms 5-6 year h/o diabetes.  Patient denies any h/o foot wounds.  [facility] is patient's PCP.  Past Medical History:  Diagnosis Date   Hypertension    Patient Active Problem List   Diagnosis Date Noted   Actinic keratosis 01/03/2023   Encounter for fitting and adjustment of hearing aid 01/03/2023   Encounter for screening for eye and ear disorders 01/03/2023   Epiphora due to excess lacrimation, bilateral lacrimal glands 01/03/2023   Essential (primary) hypertension 01/03/2023   Gastro-esophageal reflux 01/03/2023   Gout 01/03/2023   Hyperlipidemia 01/03/2023   Impacted cerumen, bilateral 01/03/2023   Impetigo 01/03/2023   Low back pain, unspecified 01/03/2023   Microalbuminuria due to type 2 diabetes mellitus (HCC) 01/03/2023   Neoplasm of uncertain behavior of skin 01/03/2023   Open angle with borderline findings, low risk, left eye 01/03/2023   Pain in right hip 01/03/2023   Parkinson's disease without dyskinesia, without mention of fluctuations (HCC) 01/03/2023   Paronychia of finger of right hand 01/03/2023   Porokeratosis 01/03/2023   Primary open-angle glaucoma, left eye, severe stage 01/03/2023   Pruritus 01/03/2023   Type 2 diabetes mellitus without complications (HCC) 01/03/2023   Senile ectropion of right lower eyelid 01/03/2023   Personal history of other malignant neoplasm of skin 03/30/2013   History reviewed. No pertinent surgical history. Current Outpatient Medications on File Prior to Visit  Medication Sig Dispense Refill   allopurinol (ZYLOPRIM) 100 MG tablet Take 100 mg by mouth daily.  3   amLODipine (NORVASC) 5 MG tablet Take 5 mg daily by mouth.  3   amoxicillin (AMOXIL)  500 MG capsule Take 2,000 mg by mouth as directed. 1 hour before dental procedures  1   atorvastatin (LIPITOR) 40 MG tablet Take 40 mg daily by mouth.  3   carvedilol (COREG) 12.5 MG tablet Take 12.5 mg by mouth 2 (two) times daily.      [facility] D3 1000 units capsule Take 1,000 Units by mouth 2 (two) times daily.  3   empagliflozin (JARDIANCE) 10 MG TABS tablet Take 5 mg by mouth daily.     lisinopril (PRINIVIL,ZESTRIL) 40 MG tablet Take 40 mg by mouth daily.      meloxicam (MOBIC) 15 MG tablet TAKE 1 TABLET (15 MG TOTAL) BY MOUTH DAILY. 30 tablet 1   methylPREDNISolone (MEDROL DOSEPAK) 4 MG TBPK tablet 6 day dose pack - take as directed 21 tablet 0   Omeprazole 20 MG TBDD Take 40 mg by mouth daily.   3   Semaglutide,0.25 or 0.5MG /DOS, (OZEMPIC, 0.25 OR 0.5 MG/DOSE,) 2 MG/1.5ML SOPN Inject into the skin.     No current facility-administered medications on file prior to visit.    Allergies  Allergen Reactions   Haloperidol Other (See Comments)   Codeine Other (See Comments)    NOT ASSESSED   Empagliflozin Other (See Comments)   Hydrochlorothiazide Other (See Comments)    Gout   Metformin Diarrhea   Social History   Occupational History   Not on file  Tobacco Use   Smoking status: Never   Smokeless tobacco: Never  Substance and Sexual Activity   Alcohol use: Yes  Comment: 1 drink/month   Drug use: No   Sexual activity: Not on file   History reviewed. No pertinent family history. Immunization History  Administered Date(s) Administered   Moderna Covid-19 Fall Seasonal Vaccine 60yrs & older 04/01/2022, 04/05/2023     Review of Systems: Negative except as noted in the HPI.   Objective: There were no vitals filed for this visit.  Gary Owens is a pleasant 77 y.o. male in NAD. AAO X 3.  Title   Diabetic Foot Exam - detailed Date & Time: 04/11/2023  2:00 PM Diabetic Foot exam was performed with the following findings: Yes  Visual Foot Exam completed.: Yes  Is there a  history of foot ulcer?: No Is there a foot ulcer now?: No Is there swelling?: No Is there elevated skin temperature?: No Is there abnormal foot shape?: No Is there a claw toe deformity?: No Are the toenails long?: Yes Are the toenails thick?: Yes Are the toenails ingrown?: No Is the skin thin, fragile, shiny and hairless?": No Normal Range of Motion?: Yes Is there foot or ankle muscle weakness?: No Do you have pain in calf while walking?: No Are the shoes appropriate in style and fit?: Yes Can the patient see the bottom of their feet?: No Pulse Foot Exam completed.: Yes   Right Posterior Tibialis: Present Left posterior Tibialis: Present   Right Dorsalis Pedis: Present Left Dorsalis Pedis: Present     Sensory Foot Exam Completed.: Yes Semmes-Weinstein Monofilament Test "+" means "has sensation" and "-" means "no sensation"  R Foot Test Control: Pos L Foot Test Control: Pos   R Site 1-Great Toe: Pos L Site 1-Great Toe: Pos   R Site 4: Pos L Site 4: Pos   R site 5: Pos L Site 5: Pos  R Site 6: Pos L Site 6: Pos     Image components are not supported.   Image components are not supported. Image components are not supported.  Tuning [place] Right vibratory: present Left vibratory: present  Comments     ADA Risk Categorization: Low Risk :  Patient has all of the following: Intact protective sensation No prior foot ulcer  No severe deformity Pedal pulses present  Assessment: 1. Pain due to onychomycosis of toenails of both feet   2. Type 2 diabetes mellitus without complication, without long-term current use of insulin (HCC)   3. Encounter for diabetic foot exam [facility])     Plan: -Patient was evaluated today. All questions/concerns addressed on today's visit. -Diabetic foot examination performed today. -Continue diabetic foot care principles: inspect feet daily, monitor glucose as recommended by PCP and/or Endocrinologist, and follow prescribed diet per PCP,  Endocrinologist and/or dietician. -Continue supportive shoe gear daily. -Toenails 1-5 b/l were debrided in length and girth with sterile nail nippers and dremel without iatrogenic bleeding.  -Patient/POA to call should there be question/concern in the interim. Return in about 3 months (around 07/12/2023).  Freddie Breech, DPM

## 2023-04-22 ENCOUNTER — Other Ambulatory Visit: Payer: Self-pay | Admitting: Podiatry

## 2023-07-14 ENCOUNTER — Ambulatory Visit (INDEPENDENT_AMBULATORY_CARE_PROVIDER_SITE_OTHER): Payer: Medicare HMO | Admitting: Podiatry

## 2023-07-14 DIAGNOSIS — Z91199 Patient's noncompliance with other medical treatment and regimen due to unspecified reason: Secondary | ICD-10-CM

## 2023-07-14 NOTE — Progress Notes (Signed)
1. No-show for appointment

## 2023-10-28 DIAGNOSIS — E11620 Type 2 diabetes mellitus with diabetic dermatitis: Secondary | ICD-10-CM | POA: Diagnosis not present

## 2023-10-28 DIAGNOSIS — Z85828 Personal history of other malignant neoplasm of skin: Secondary | ICD-10-CM | POA: Diagnosis not present

## 2023-10-28 DIAGNOSIS — I10 Essential (primary) hypertension: Secondary | ICD-10-CM | POA: Diagnosis not present

## 2023-10-28 DIAGNOSIS — G20A1 Parkinson's disease without dyskinesia, without mention of fluctuations: Secondary | ICD-10-CM | POA: Diagnosis not present

## 2023-10-28 DIAGNOSIS — Z8249 Family history of ischemic heart disease and other diseases of the circulatory system: Secondary | ICD-10-CM | POA: Diagnosis not present

## 2023-10-28 DIAGNOSIS — Z7985 Long-term (current) use of injectable non-insulin antidiabetic drugs: Secondary | ICD-10-CM | POA: Diagnosis not present

## 2023-10-28 DIAGNOSIS — H409 Unspecified glaucoma: Secondary | ICD-10-CM | POA: Diagnosis not present

## 2023-10-28 DIAGNOSIS — R32 Unspecified urinary incontinence: Secondary | ICD-10-CM | POA: Diagnosis not present

## 2023-10-28 DIAGNOSIS — E785 Hyperlipidemia, unspecified: Secondary | ICD-10-CM | POA: Diagnosis not present

## 2023-10-28 DIAGNOSIS — Z008 Encounter for other general examination: Secondary | ICD-10-CM | POA: Diagnosis not present

## 2023-10-28 DIAGNOSIS — K219 Gastro-esophageal reflux disease without esophagitis: Secondary | ICD-10-CM | POA: Diagnosis not present

## 2023-10-28 DIAGNOSIS — B0223 Postherpetic polyneuropathy: Secondary | ICD-10-CM | POA: Diagnosis not present

## 2023-10-28 DIAGNOSIS — Z7984 Long term (current) use of oral hypoglycemic drugs: Secondary | ICD-10-CM | POA: Diagnosis not present

## 2024-05-21 DIAGNOSIS — F4320 Adjustment disorder, unspecified: Secondary | ICD-10-CM | POA: Diagnosis not present
